# Patient Record
Sex: Female | Born: 1965 | Race: White | Hispanic: No | Smoking: Current every day smoker
Health system: Southern US, Community
[De-identification: ages and names within clinical notes are randomized; demographics above are authoritative.]

## PROBLEM LIST (undated history)

## (undated) DIAGNOSIS — J45909 Unspecified asthma, uncomplicated: Secondary | ICD-10-CM

## (undated) DIAGNOSIS — Z9889 Other specified postprocedural states: Secondary | ICD-10-CM

## (undated) DIAGNOSIS — R112 Nausea with vomiting, unspecified: Secondary | ICD-10-CM

## (undated) HISTORY — PX: CHOLECYSTECTOMY: SHX55

## (undated) HISTORY — PX: TUBAL LIGATION: SHX77

---

## 2005-05-13 ENCOUNTER — Ambulatory Visit (HOSPITAL_COMMUNITY): Admission: RE | Admit: 2005-05-13 | Discharge: 2005-05-13 | Payer: Self-pay | Admitting: Family Medicine

## 2007-12-29 ENCOUNTER — Ambulatory Visit (HOSPITAL_COMMUNITY): Admission: RE | Admit: 2007-12-29 | Discharge: 2007-12-29 | Payer: Self-pay | Admitting: Family Medicine

## 2008-01-18 ENCOUNTER — Ambulatory Visit (HOSPITAL_COMMUNITY): Admission: RE | Admit: 2008-01-18 | Discharge: 2008-01-18 | Payer: Self-pay | Admitting: Family Medicine

## 2010-07-10 ENCOUNTER — Ambulatory Visit (HOSPITAL_COMMUNITY)
Admission: RE | Admit: 2010-07-10 | Discharge: 2010-07-10 | Payer: Self-pay | Source: Home / Self Care | Attending: Family Medicine | Admitting: Family Medicine

## 2010-07-19 ENCOUNTER — Encounter: Payer: Self-pay | Admitting: Family Medicine

## 2010-07-21 ENCOUNTER — Encounter: Payer: Self-pay | Admitting: Family Medicine

## 2010-11-14 NOTE — Procedures (Signed)
NAME:  Rachael Waller, Rachael Waller               ACCOUNT NO.:  [id number]   MEDICAL RECORD NO.:  [id number]          PATIENT TYPE:  OUT   LOCATION:  RESP                          FACILITY:  [facility]   PHYSICIAN:  Edward L. Juanetta Gosling, M.D.DATE OF BIRTH:  04/03/1966   DATE OF PROCEDURE:  01/19/2008  DATE OF DISCHARGE:  01/18/2008                            PULMONARY FUNCTION TEST   Spirometry shows a mild ventilatory defect with evidence of airflow  obstruction in the smaller airways.  This is consistent with a diagnosis  of asthma, but also consistent with a diagnosis of COPD, depending on  the clinical situation.      Edward L. Juanetta Gosling, M.D.  Electronically Signed     EL[facility]/MEDQ  D:  01/19/2008  T:  01/20/2008  Job:  [id number]

## 2012-07-12 ENCOUNTER — Other Ambulatory Visit (HOSPITAL_COMMUNITY): Payer: Self-pay | Admitting: Family Medicine

## 2012-07-12 DIAGNOSIS — Z139 Encounter for screening, unspecified: Secondary | ICD-10-CM

## 2012-07-18 ENCOUNTER — Ambulatory Visit (HOSPITAL_COMMUNITY): Payer: Self-pay

## 2014-07-24 ENCOUNTER — Other Ambulatory Visit (HOSPITAL_COMMUNITY): Payer: Self-pay | Admitting: Family Medicine

## 2014-07-24 DIAGNOSIS — Z1231 Encounter for screening mammogram for malignant neoplasm of breast: Secondary | ICD-10-CM

## 2014-07-25 ENCOUNTER — Other Ambulatory Visit (HOSPITAL_COMMUNITY): Payer: Self-pay | Admitting: Family Medicine

## 2014-07-25 DIAGNOSIS — Z09 Encounter for follow-up examination after completed treatment for conditions other than malignant neoplasm: Secondary | ICD-10-CM

## 2014-07-30 ENCOUNTER — Ambulatory Visit (HOSPITAL_COMMUNITY): Payer: Self-pay

## 2014-08-14 ENCOUNTER — Ambulatory Visit (HOSPITAL_COMMUNITY): Payer: Self-pay

## 2014-08-17 ENCOUNTER — Ambulatory Visit (HOSPITAL_COMMUNITY): Payer: Self-pay

## 2014-08-21 ENCOUNTER — Ambulatory Visit (HOSPITAL_COMMUNITY): Payer: Self-pay

## 2015-05-02 ENCOUNTER — Ambulatory Visit (HOSPITAL_COMMUNITY)
Admission: RE | Admit: 2015-05-02 | Discharge: 2015-05-02 | Disposition: A | Discharge status: Home / Self Care | Payer: 59 | Source: Ambulatory Visit | Attending: Family Medicine | Admitting: Family Medicine

## 2015-05-02 ENCOUNTER — Other Ambulatory Visit (HOSPITAL_COMMUNITY): Payer: Self-pay | Admitting: Family Medicine

## 2015-05-02 DIAGNOSIS — M21611 Bunion of right foot: Secondary | ICD-10-CM | POA: Diagnosis not present

## 2015-05-02 DIAGNOSIS — M79671 Pain in right foot: Secondary | ICD-10-CM

## 2015-05-02 DIAGNOSIS — M19071 Primary osteoarthritis, right ankle and foot: Secondary | ICD-10-CM | POA: Insufficient documentation

## 2015-05-02 DIAGNOSIS — N6002 Solitary cyst of left breast: Secondary | ICD-10-CM

## 2015-05-14 ENCOUNTER — Encounter (HOSPITAL_COMMUNITY): Payer: 59

## 2015-05-28 ENCOUNTER — Encounter (HOSPITAL_COMMUNITY): Payer: 59

## 2015-10-14 ENCOUNTER — Emergency Department (HOSPITAL_COMMUNITY): Payer: 59

## 2015-10-14 ENCOUNTER — Emergency Department (HOSPITAL_COMMUNITY)
Admission: EM | Admit: 2015-10-14 | Discharge: 2015-10-15 | Disposition: A | Discharge status: Home / Self Care | Payer: 59 | Attending: Emergency Medicine | Admitting: Emergency Medicine

## 2015-10-14 ENCOUNTER — Encounter (HOSPITAL_COMMUNITY): Payer: Self-pay | Admitting: Emergency Medicine

## 2015-10-14 DIAGNOSIS — F17210 Nicotine dependence, cigarettes, uncomplicated: Secondary | ICD-10-CM | POA: Diagnosis not present

## 2015-10-14 DIAGNOSIS — K5900 Constipation, unspecified: Secondary | ICD-10-CM

## 2015-10-14 DIAGNOSIS — J45909 Unspecified asthma, uncomplicated: Secondary | ICD-10-CM | POA: Diagnosis not present

## 2015-10-14 DIAGNOSIS — Z79899 Other long term (current) drug therapy: Secondary | ICD-10-CM | POA: Diagnosis not present

## 2015-10-14 DIAGNOSIS — R1031 Right lower quadrant pain: Secondary | ICD-10-CM | POA: Diagnosis present

## 2015-10-14 DIAGNOSIS — R1011 Right upper quadrant pain: Secondary | ICD-10-CM

## 2015-10-14 HISTORY — DX: Unspecified asthma, uncomplicated: J45.909

## 2015-10-14 LAB — BASIC METABOLIC PANEL
Anion gap: 8 (ref 5–15)
BUN: 9 mg/dL (ref 6–20)
CO2: 23 mmol/L (ref 22–32)
CREATININE: 0.58 mg/dL (ref 0.44–1.00)
Calcium: 8.6 mg/dL — ABNORMAL LOW (ref 8.9–10.3)
Chloride: 107 mmol/L (ref 101–111)
GFR calc Af Amer: >60 mL/min (ref >60)
Glucose, Bld: 92 mg/dL (ref 65–99)
Potassium: 3.4 mmol/L — ABNORMAL LOW (ref 3.5–5.1)
Sodium: 138 mmol/L (ref 135–145)

## 2015-10-14 LAB — CBC WITH DIFFERENTIAL/PLATELET
BASOS PCT: 0 %
Basophils Absolute: 0 10*3/uL (ref 0.0–0.1)
EOS ABS: 0.5 10*3/uL (ref 0.0–0.7)
Eosinophils Relative: 4 %
HCT: 39.9 % (ref 36.0–46.0)
Hemoglobin: 13.2 g/dL (ref 12.0–15.0)
Lymphocytes Relative: 41 %
Lymphs Abs: 4.2 10*3/uL — ABNORMAL HIGH (ref 0.7–4.0)
MCH: 30.1 pg (ref 26.0–34.0)
MCHC: 33.1 g/dL (ref 30.0–36.0)
MCV: 91.1 fL (ref 78.0–100.0)
Monocytes Absolute: 0.5 10*3/uL (ref 0.1–1.0)
Monocytes Relative: 5 %
Neutro Abs: 5.1 10*3/uL (ref 1.7–7.7)
Neutrophils Relative %: 50 %
Platelets: 318 10*3/uL (ref 150–400)
RBC: 4.38 MIL/uL (ref 3.87–5.11)
RDW: 13.4 % (ref 11.5–15.5)
WBC: 10.3 10*3/uL (ref 4.0–10.5)

## 2015-10-14 LAB — URINE MICROSCOPIC-ADD ON

## 2015-10-14 LAB — URINALYSIS, ROUTINE W REFLEX MICROSCOPIC
BILIRUBIN URINE: NEGATIVE
Glucose, UA: NEGATIVE mg/dL
Ketones, ur: NEGATIVE mg/dL
Leukocytes, UA: NEGATIVE
Nitrite: NEGATIVE
Protein, ur: NEGATIVE mg/dL
Specific Gravity, Urine: 1.01 (ref 1.005–1.030)
pH: 6 (ref 5.0–8.0)

## 2015-10-14 MED ORDER — KETOROLAC TROMETHAMINE 30 MG/ML IJ SOLN
30.0000 mg | Freq: Once | INTRAMUSCULAR | Status: AC
  Administered 2015-10-14: 30 mg via INTRAVENOUS
  Filled 2015-10-14: qty 1

## 2015-10-14 NOTE — ED Provider Notes (Signed)
CSN: [id number]     Arrival date & time 10/14/15  1809 History  By signing my name below, I, Phillis HaggisGabriella Gaje, attest that this documentation has been prepared under the direction and in the presence of Devoria AlbeIva Sanvi Ehler, MD at 2328 PM Electronically Signed: Phillis HaggisGabriella Gaje, ED Scribe. 10/14/2015. 4:42 AM.    Chief Complaint  Patient presents with  . Abdominal Pain   The history is provided by the patient. No language interpreter was used.  HPI Comments: Rachael Waller is a 50 y.o. Female with a hx of hematuria who presents to the Emergency Department complaining of sharp intermittent RLQ abdominal pain onset 5 days ago. She reports 2 episodes of pain lasting a few seconds on Thursday, April 13. She reports that it has been dull and intermittent for the remainder of the weekend, but began to be dull and constant all day today. She reports associated nausea, decreased appetite, dysuria, and diarrhea x6. Pt reports worsening nausea with eating and worsening pain with movement. 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 She denies injury, fall, fever, vomiting, hematuria, or frequency. She states she has had hematuria in the past but a source was not found for it. Pt is a smoker, 1 ppd. Pt uses an albuterol inhaler and takes Valium.   PCP: Pershing ProudJACKSON,SAMANTHA, PA-C  Past Medical History  Diagnosis Date  . Asthma    Past Surgical History  Procedure Laterality Date  . Cholecystectomy     History reviewed. No pertinent family history. Social History  Substance Use Topics  . Smoking status: Current Every Day Smoker -- 0.50 packs/day    Types: Cigarettes  . Smokeless tobacco: None  . Alcohol Use: No   employed  OB History    No data available     Review of Systems  Gastrointestinal: Positive for abdominal pain.  All other systems reviewed and are negative.     Allergies  Review of patient's allergies indicates no known allergies.  Home Medications   Prior to Admission medications    Medication Sig Start Date End Date Taking? Authorizing Provider  albuterol (PROVENTIL HFA;VENTOLIN HFA) 108 (90 Base) MCG/ACT inhaler Inhale 1-2 puffs into the lungs every 6 (six) hours as needed for wheezing or shortness of breath.   Yes Historical Provider, MD  albuterol (PROVENTIL) (2.5 MG/3ML) 0.083% nebulizer solution Take 2.5 mg by nebulization every 6 (six) hours as needed for wheezing or shortness of breath.   Yes Historical Provider, MD  diazepam (VALIUM) 10 MG tablet Take 10 mg by mouth every 12 (twelve) hours as needed for anxiety.   Yes Historical Provider, MD  ketotifen (ALLERGY EYE DROPS) 0.025 % ophthalmic solution Place 1 drop into both eyes daily as needed (for allergy eye).   Yes Historical Provider, MD   BP 110/72 mmHg  Pulse 65  Temp(Src) 98.3 F (36.8 C) (Oral)  Resp 16  Ht 5\' 2"  (1.575 m)  Wt 113 lb (51.256 kg)  BMI 20.66 kg/m2  SpO2 100%  Vital signs normal   Physical Exam  Constitutional: She is oriented to person, place, and time. She appears well-developed and well-nourished.  Non-toxic appearance. She does not appear ill. No distress.  HENT:  Head: Normocephalic and atraumatic.  Right Ear: External ear normal.  Left Ear: External ear normal.  Nose: Nose normal. No mucosal edema or rhinorrhea.  Mouth/Throat: Oropharynx is clear and moist and mucous membranes are normal. No dental abscesses or uvula swelling.  Eyes: Conjunctivae and EOM are  normal. Pupils are equal, round, and reactive to light.  Neck: Normal range of motion and full passive range of motion without pain. Neck supple.  Cardiovascular: Normal rate, regular rhythm and normal heart sounds.  Exam reveals no gallop and no friction rub.   No murmur heard. Pulmonary/Chest: Effort normal and breath sounds normal. No respiratory distress. She has no wheezes. She has no rhonchi. She has no rales. She exhibits no tenderness and no crepitus.  Abdominal: Soft. Normal appearance and bowel sounds are normal.  She exhibits no distension. There is tenderness. There is no rebound, no guarding and no CVA tenderness.    Tenderness to mid right abdomen  Genitourinary:  No CVA tenderness  Musculoskeletal: Normal range of motion. She exhibits no edema or tenderness.  Moves all extremities well.   Neurological: She is alert and oriented to person, place, and time. She has normal strength. No cranial nerve deficit.  Skin: Skin is warm, dry and intact. No rash noted. No erythema. No pallor.  Psychiatric: She has a normal mood and affect. Her speech is normal and behavior is normal. Her mood appears not anxious.  Nursing note and vitals reviewed.   ED Course  Procedures (including critical care time)  Medications  ketorolac (TORADOL) 30 MG/ML injection 30 mg (30 mg Intravenous Given 10/14/15 2341)    DIAGNOSTIC STUDIES: Oxygen Saturation is 100% on RA, normal by my interpretation.    COORDINATION OF CARE: 11:35 PM-Discussed treatment plan which includes labs with pt at bedside and pt agreed to plan.    Patient's pain improved with the Toradol. We discussed her CT scan and we look at it shows a lot of stool in her right colon. She was advised to take MiraLAX over-the-counter. She should return if she gets worse such as getting a fever, vomiting or worsening pain.  Labs Review Results for orders placed or performed during the hospital encounter of 10/14/15  Urinalysis, Routine w reflex microscopic (not at [facility])  Result Value Ref Range   Color, Urine YELLOW YELLOW   APPearance CLEAR CLEAR   Specific Gravity, Urine 1.010 1.005 - 1.030   pH 6.0 5.0 - 8.0   Glucose, UA NEGATIVE NEGATIVE mg/dL   Hgb urine dipstick MODERATE (A) NEGATIVE   Bilirubin Urine NEGATIVE NEGATIVE   Ketones, ur NEGATIVE NEGATIVE mg/dL   Protein, ur NEGATIVE NEGATIVE mg/dL   Nitrite NEGATIVE NEGATIVE   Leukocytes, UA NEGATIVE NEGATIVE  CBC with Differential  Result Value Ref Range   WBC 10.3 4.0 - 10.5 K/uL   RBC 4.38  3.87 - 5.11 MIL/uL   Hemoglobin 13.2 12.0 - 15.0 g/dL   HCT 11.9 14.7 - 82.9 %   MCV 91.1 78.0 - 100.0 fL   MCH 30.1 26.0 - 34.0 pg   MCHC 33.1 30.0 - 36.0 g/dL   RDW 56.2 13.0 - 86.5 %   Platelets 318 150 - 400 K/uL   Neutrophils Relative % 50 %   Neutro Abs 5.1 1.7 - 7.7 K/uL   Lymphocytes Relative 41 %   Lymphs Abs 4.2 (H) 0.7 - 4.0 K/uL   Monocytes Relative 5 %   Monocytes Absolute 0.5 0.1 - 1.0 K/uL   Eosinophils Relative 4 %   Eosinophils Absolute 0.5 0.0 - 0.7 K/uL   Basophils Relative 0 %   Basophils Absolute 0.0 0.0 - 0.1 K/uL  Basic metabolic panel  Result Value Ref Range   Sodium 138 135 - 145 mmol/L   Potassium 3.4 (L) 3.5 -  5.1 mmol/L   Chloride 107 101 - 111 mmol/L   CO2 23 22 - 32 mmol/L   Glucose, Bld 92 65 - 99 mg/dL   BUN 9 6 - 20 mg/dL   Creatinine, Ser 1.61 0.44 - 1.00 mg/dL   Calcium 8.6 (L) 8.9 - 10.3 mg/dL   GFR calc non Af Amer >60 >60 mL/min   GFR calc Af Amer >60 >60 mL/min   Anion gap 8 5 - 15  Urine microscopic-add on  Result Value Ref Range   Squamous Epithelial / LPF 0-5 (A) NONE SEEN   WBC, UA 0-5 0 - 5 WBC/hpf   RBC / HPF 0-5 0 - 5 RBC/hpf   Bacteria, UA FEW (A) NONE SEEN   Laboratory interpretation all normal except microscopic hematuria   Imaging Review Ct Renal Stone Study  10/15/2015  CLINICAL DATA:  Sharp intermittent right lower quadrant abdominal pain for 5 days. Nausea, decreased appetite, dysuria, and diarrhea. EXAM: CT ABDOMEN AND PELVIS WITHOUT CONTRAST TECHNIQUE: Multidetector CT imaging of the abdomen and pelvis was performed following the standard protocol without IV contrast. COMPARISON:  None. FINDINGS: Mild dependent atelectasis in the lung bases. Low lying right kidney. Kidneys otherwise appear intact and symmetrical. No hydronephrosis or hydroureter. No renal, ureteral, or bladder stones. Bladder wall is not thickened. Surgical absence of the gallbladder. The unenhanced appearance of the liver, spleen, adrenal glands,  abdominal aorta, inferior vena cava, and retroperitoneal lymph nodes is unremarkable. Small focal calcifications of the body of the pancreas may represent changes due to chronic pancreatitis or could indicate calcification adjacent vessels. Stomach, small bowel, and colon are not abnormally distended. Scattered stool throughout the colon. No free air or free fluid in the abdomen. Pelvis: The appendix is normal. Surgical clips consistent with tubal ligations. Uterus and ovaries are otherwise normal. No free or loculated pelvic fluid collections. No pelvic mass or lymphadenopathy. No destructive bone lesions. IMPRESSION: No renal or ureteral stone or obstruction. Electronically Signed   By: Burman Nieves M.D.   On: 10/15/2015 00:05   I have personally reviewed and evaluated these images and lab results as part of my medical decision-making.   EKG Interpretation None      MDM   Final diagnoses:  Right upper quadrant pain  Constipation, unspecified constipation type   Discharge medications miralax OTC  Plan discharge  Devoria Albe, MD, Concha Pyo, MD 10/15/15 [phone]

## 2015-10-14 NOTE — ED Notes (Signed)
Having sharp and dull pain to right abdomen.  C/o loose pasty stools (5-6), brown in color.  Rates pain 10/10.

## 2015-10-15 NOTE — Discharge Instructions (Signed)
Get miralax and put one dose or 17 g in 8 ounces of water,  take 1 dose every 30 minutes for 2-3 hours or until you  get good results and then once or twice daily to prevent constipation. Return to the ED if you get worse such as fever, vomiting worsening abdominal pain.     Constipation, Adult Constipation is when a person:  Poops (has a bowel movement) less than 3 times a week.  Has a hard time pooping.  Has poop that is dry, hard, or bigger than normal. HOME CARE   Eat foods with a lot of fiber in them. This includes fruits, vegetables, beans, and whole grains such as brown rice.  Avoid fatty foods and foods with a lot of sugar. This includes french fries, hamburgers, cookies, candy, and soda.  If you are not getting enough fiber from food, take products with added fiber in them (supplements).  Drink enough fluid to keep your pee (urine) clear or pale yellow.  Exercise on a regular basis, or as told by your doctor.  Go to the restroom when you feel like you need to poop. Do not hold it.  Only take medicine as told by your doctor. Do not take medicines that help you poop (laxatives) without talking to your doctor first. GET HELP RIGHT AWAY IF:   You have bright red blood in your poop (stool).  Your constipation lasts more than 4 days or gets worse.  You have belly (abdominal) or butt (rectal) pain.  You have thin poop (as thin as a pencil).  You lose weight, and it cannot be explained. MAKE SURE YOU:   Understand these instructions.  Will watch your condition.  Will get help right away if you are not doing well or get worse.   This information is not intended to replace advice given to you by your health care provider. Make sure you discuss any questions you have with your health care provider.   Document Released: 12/02/2007 Document Revised: 07/06/2014 Document Reviewed: 03/27/2013 Elsevier Interactive Patient Education [employer]2016 [employer] Inc[employer]

## 2016-10-31 DIAGNOSIS — J45909 Unspecified asthma, uncomplicated: Secondary | ICD-10-CM | POA: Diagnosis not present

## 2016-10-31 DIAGNOSIS — R509 Fever, unspecified: Secondary | ICD-10-CM | POA: Diagnosis not present

## 2016-10-31 DIAGNOSIS — J189 Pneumonia, unspecified organism: Secondary | ICD-10-CM | POA: Diagnosis not present

## 2016-10-31 DIAGNOSIS — R21 Rash and other nonspecific skin eruption: Secondary | ICD-10-CM | POA: Diagnosis not present

## 2016-10-31 DIAGNOSIS — F17200 Nicotine dependence, unspecified, uncomplicated: Secondary | ICD-10-CM | POA: Diagnosis not present

## 2016-11-05 DIAGNOSIS — Z1389 Encounter for screening for other disorder: Secondary | ICD-10-CM | POA: Diagnosis not present

## 2016-11-05 DIAGNOSIS — Z6820 Body mass index (BMI) 20.0-20.9, adult: Secondary | ICD-10-CM | POA: Diagnosis not present

## 2016-11-05 DIAGNOSIS — J181 Lobar pneumonia, unspecified organism: Secondary | ICD-10-CM | POA: Diagnosis not present

## 2016-11-05 DIAGNOSIS — N390 Urinary tract infection, site not specified: Secondary | ICD-10-CM | POA: Diagnosis not present

## 2016-11-05 DIAGNOSIS — F4320 Adjustment disorder, unspecified: Secondary | ICD-10-CM | POA: Diagnosis not present

## 2016-11-09 ENCOUNTER — Other Ambulatory Visit (HOSPITAL_COMMUNITY): Payer: Self-pay | Admitting: Family Medicine

## 2016-11-09 DIAGNOSIS — Z1231 Encounter for screening mammogram for malignant neoplasm of breast: Secondary | ICD-10-CM

## 2017-07-01 DIAGNOSIS — Z6821 Body mass index (BMI) 21.0-21.9, adult: Secondary | ICD-10-CM | POA: Diagnosis not present

## 2017-07-01 DIAGNOSIS — Z1389 Encounter for screening for other disorder: Secondary | ICD-10-CM | POA: Diagnosis not present

## 2017-07-01 DIAGNOSIS — M19071 Primary osteoarthritis, right ankle and foot: Secondary | ICD-10-CM | POA: Diagnosis not present

## 2017-07-01 DIAGNOSIS — D179 Benign lipomatous neoplasm, unspecified: Secondary | ICD-10-CM | POA: Diagnosis not present

## 2017-07-15 DIAGNOSIS — Z124 Encounter for screening for malignant neoplasm of cervix: Secondary | ICD-10-CM | POA: Diagnosis not present

## 2017-07-15 DIAGNOSIS — Z9189 Other specified personal risk factors, not elsewhere classified: Secondary | ICD-10-CM | POA: Diagnosis not present

## 2017-07-15 DIAGNOSIS — Z01411 Encounter for gynecological examination (general) (routine) with abnormal findings: Secondary | ICD-10-CM | POA: Diagnosis not present

## 2017-07-15 DIAGNOSIS — Z6821 Body mass index (BMI) 21.0-21.9, adult: Secondary | ICD-10-CM | POA: Diagnosis not present

## 2017-07-15 DIAGNOSIS — J069 Acute upper respiratory infection, unspecified: Secondary | ICD-10-CM | POA: Diagnosis not present

## 2017-07-15 DIAGNOSIS — Z1389 Encounter for screening for other disorder: Secondary | ICD-10-CM | POA: Diagnosis not present

## 2017-07-19 DIAGNOSIS — L720 Epidermal cyst: Secondary | ICD-10-CM | POA: Diagnosis not present

## 2017-08-25 DIAGNOSIS — Z6821 Body mass index (BMI) 21.0-21.9, adult: Secondary | ICD-10-CM | POA: Diagnosis not present

## 2017-08-25 DIAGNOSIS — Z1389 Encounter for screening for other disorder: Secondary | ICD-10-CM | POA: Diagnosis not present

## 2017-08-27 DIAGNOSIS — Z6821 Body mass index (BMI) 21.0-21.9, adult: Secondary | ICD-10-CM | POA: Diagnosis not present

## 2017-08-27 DIAGNOSIS — R87810 Cervical high risk human papillomavirus (HPV) DNA test positive: Secondary | ICD-10-CM | POA: Diagnosis not present

## 2017-10-12 DIAGNOSIS — Z6821 Body mass index (BMI) 21.0-21.9, adult: Secondary | ICD-10-CM | POA: Diagnosis not present

## 2017-10-12 DIAGNOSIS — J45909 Unspecified asthma, uncomplicated: Secondary | ICD-10-CM | POA: Diagnosis not present

## 2018-02-25 DIAGNOSIS — N390 Urinary tract infection, site not specified: Secondary | ICD-10-CM | POA: Diagnosis not present

## 2018-07-14 DIAGNOSIS — R0982 Postnasal drip: Secondary | ICD-10-CM | POA: Diagnosis not present

## 2018-07-14 DIAGNOSIS — Z124 Encounter for screening for malignant neoplasm of cervix: Secondary | ICD-10-CM | POA: Diagnosis not present

## 2018-07-14 DIAGNOSIS — R87619 Unspecified abnormal cytological findings in specimens from cervix uteri: Secondary | ICD-10-CM | POA: Diagnosis not present

## 2018-07-14 DIAGNOSIS — Z6821 Body mass index (BMI) 21.0-21.9, adult: Secondary | ICD-10-CM | POA: Diagnosis not present

## 2018-07-14 DIAGNOSIS — Z1389 Encounter for screening for other disorder: Secondary | ICD-10-CM | POA: Diagnosis not present

## 2018-08-11 DIAGNOSIS — J111 Influenza due to unidentified influenza virus with other respiratory manifestations: Secondary | ICD-10-CM | POA: Diagnosis not present

## 2018-08-11 DIAGNOSIS — Z6821 Body mass index (BMI) 21.0-21.9, adult: Secondary | ICD-10-CM | POA: Diagnosis not present

## 2018-08-11 DIAGNOSIS — Z1389 Encounter for screening for other disorder: Secondary | ICD-10-CM | POA: Diagnosis not present

## 2019-02-07 ENCOUNTER — Other Ambulatory Visit (HOSPITAL_COMMUNITY): Payer: Self-pay | Admitting: Family Medicine

## 2019-02-07 DIAGNOSIS — R928 Other abnormal and inconclusive findings on diagnostic imaging of breast: Secondary | ICD-10-CM

## 2019-03-26 DIAGNOSIS — M542 Cervicalgia: Secondary | ICD-10-CM | POA: Diagnosis not present

## 2019-03-26 DIAGNOSIS — S199XXA Unspecified injury of neck, initial encounter: Secondary | ICD-10-CM | POA: Diagnosis not present

## 2019-03-26 DIAGNOSIS — F17200 Nicotine dependence, unspecified, uncomplicated: Secondary | ICD-10-CM | POA: Diagnosis not present

## 2019-03-26 DIAGNOSIS — M79602 Pain in left arm: Secondary | ICD-10-CM | POA: Diagnosis not present

## 2019-03-26 DIAGNOSIS — M5412 Radiculopathy, cervical region: Secondary | ICD-10-CM | POA: Diagnosis not present

## 2019-09-01 DIAGNOSIS — M25551 Pain in right hip: Secondary | ICD-10-CM | POA: Diagnosis not present

## 2019-09-01 DIAGNOSIS — F419 Anxiety disorder, unspecified: Secondary | ICD-10-CM | POA: Diagnosis not present

## 2019-10-15 ENCOUNTER — Emergency Department (HOSPITAL_COMMUNITY): Payer: Commercial Managed Care - PPO

## 2019-10-15 ENCOUNTER — Other Ambulatory Visit: Payer: Self-pay

## 2019-10-15 ENCOUNTER — Emergency Department (HOSPITAL_COMMUNITY)
Admission: EM | Admit: 2019-10-15 | Discharge: 2019-10-15 | Disposition: A | Discharge status: Home / Self Care | Payer: Commercial Managed Care - PPO | Attending: Emergency Medicine | Admitting: Emergency Medicine

## 2019-10-15 ENCOUNTER — Encounter (HOSPITAL_COMMUNITY): Payer: Self-pay

## 2019-10-15 DIAGNOSIS — F17210 Nicotine dependence, cigarettes, uncomplicated: Secondary | ICD-10-CM | POA: Insufficient documentation

## 2019-10-15 DIAGNOSIS — M5136 Other intervertebral disc degeneration, lumbar region: Secondary | ICD-10-CM | POA: Insufficient documentation

## 2019-10-15 DIAGNOSIS — M545 Low back pain, unspecified: Secondary | ICD-10-CM

## 2019-10-15 MED ORDER — KETOROLAC TROMETHAMINE 60 MG/2ML IM SOLN
60.0000 mg | Freq: Once | INTRAMUSCULAR | Status: AC
  Administered 2019-10-15: 60 mg via INTRAMUSCULAR
  Filled 2019-10-15: qty 2

## 2019-10-15 MED ORDER — DICLOFENAC SODIUM 50 MG PO TBEC: 50.0000 mg | DELAYED_RELEASE_TABLET | Freq: Two times a day (BID) | ORAL | 0 refills | Status: AC

## 2019-10-15 MED ORDER — METHOCARBAMOL 500 MG PO TABS: 500.0000 mg | ORAL_TABLET | Freq: Four times a day (QID) | ORAL | 0 refills | Status: AC

## 2019-10-15 NOTE — Discharge Instructions (Signed)
Return if any problems.  See your Physician for recheck  °

## 2019-10-15 NOTE — ED Triage Notes (Signed)
Pt started having back pain yesterday that is located in her lumbar spine. States this pain doesn't radiate anywhere. She has taken Tylenol and Ibuprofen with no relief. Larey Seat down her steps last year and hurt her back for the first time then.

## 2019-10-15 NOTE — ED Provider Notes (Addendum)
[facility] EMERGENCY DEPARTMENT Provider Note   CSN: [id number] Arrival date & time: 10/15/19  0944     History Chief Complaint  Patient presents with  . Back Pain    Rachael Waller is a 54 y.o. female.  The history is provided by the patient. No language interpreter was used.  Back Pain Location:  Lumbar spine Quality:  Aching Radiates to:  Does not radiate Pain severity:  Severe Pain is:  Same all the time Onset quality:  Gradual Timing:  Constant Progression:  Worsening Relieved by:  Nothing Worsened by:  Nothing Ineffective treatments:  None tried Associated symptoms: no abdominal pain, no numbness and no paresthesias   Risk factors: not obese   Pt reports she fell and hit her low back a year ago.  Pt reports some pain on and off.  Pt reports increasing pain recently.  No numbness      Past Medical History:  Diagnosis Date  . Asthma     There are no problems to display for this patient.   Past Surgical History:  Procedure Laterality Date  . CHOLECYSTECTOMY       OB History   No obstetric history on file.     No family history on file.  Social History   Tobacco Use  . Smoking status: Current Every Day Smoker    Packs/day: 0.50    Types: Cigarettes  . Smokeless tobacco: Never Used  Substance Use Topics  . Alcohol use: No  . Drug use: No    Home Medications Prior to Admission medications   Medication Sig Start Date End Date Taking? Authorizing Provider  albuterol (PROVENTIL HFA;VENTOLIN HFA) 108 (90 Base) MCG/ACT inhaler Inhale 1-2 puffs into the lungs every 6 (six) hours as needed for wheezing or shortness of breath.    [provider]  albuterol (PROVENTIL) (2.5 MG/3ML) 0.083% nebulizer solution Take 2.5 mg by nebulization every 6 (six) hours as needed for wheezing or shortness of breath.    [provider]  diazepam (VALIUM) 10 MG tablet Take 10 mg by mouth every 12 (twelve) hours as needed for anxiety.    [provider]  diclofenac (VOLTAREN) 50 MG EC tablet Take 1 tablet (50 mg total) by mouth 2 (two) times daily. 10/15/19   Fransico Meadow, PA-C  ketotifen (ALLERGY EYE DROPS) 0.025 % ophthalmic solution Place 1 drop into both eyes daily as needed (for allergy eye).    [provider]  methocarbamol (ROBAXIN) 500 MG tablet Take 1 tablet (500 mg total) by mouth 4 (four) times daily. 10/15/19   Fransico Meadow, PA-C    Allergies    Patient has no known allergies.  Review of Systems   Review of Systems  Gastrointestinal: Negative for abdominal pain.  Musculoskeletal: Positive for back pain.  Neurological: Negative for numbness and paresthesias.  All other systems reviewed and are negative.   Physical Exam Updated Vital Signs BP 138/70   Pulse 78   Temp 98.2 F (36.8 C) (Oral)   Resp 12   Ht 5\' 2"  (1.575 m)   Wt 62.1 kg   SpO2 99%   BMI 25.06 kg/m   Physical Exam Vitals and nursing note reviewed.  Constitutional:      Appearance: She is well-developed.  HENT:     Head: Normocephalic.     Nose: Nose normal.     Mouth/Throat:     Mouth: Mucous membranes are moist.  Cardiovascular:     Rate  and Rhythm: Normal rate and regular rhythm.  Pulmonary:     Effort: Pulmonary effort is normal.  Abdominal:     General: There is no distension.  Musculoskeletal:        General: Normal range of motion.     Cervical back: Normal range of motion.  Skin:    General: Skin is warm.  Neurological:     General: No focal deficit present.     Mental Status: She is alert and oriented to person, place, and time.  Psychiatric:        Mood and Affect: Mood normal.     ED Results / Procedures / Treatments   Labs (all labs ordered are listed, but only abnormal results are displayed) Labs Reviewed - No data to display  EKG None  Radiology DG Lumbar Spine Complete  Result Date: 10/15/2019 CLINICAL DATA:  Fall down steps/tear. Low back pain since yesterday. No recent injury  reported. EXAM: LUMBAR SPINE - COMPLETE 4+ VIEW COMPARISON:  07/10/2010 lumbar spine radiographs. 10/14/2015 unenhanced CT abdomen/pelvis. FINDINGS: This report assumes 5 non rib-bearing lumbar vertebrae. Lumbar vertebral body heights are preserved, with no fracture. Mild multilevel lumbar degenerative disc disease, most prominent at L2-3, mildly worsened. No spondylolisthesis. No appreciable facet arthropathy. No aggressive appearing focal osseous lesions. Cholecystectomy clips are seen in the right upper quadrant of the abdomen. Abdominal aortic atherosclerosis. IMPRESSION: 1. No lumbar spine fracture or spondylolisthesis. 2. Mild multilevel lumbar degenerative disc disease, most prominent at L2-3, mildly worsened since 2012 radiographs. Electronically Signed   By: Delbert Phenix M.D.   On: 10/15/2019 11:10    Procedures Procedures (including critical care time)  Medications Ordered in ED Medications  ketorolac (TORADOL) injection 60 mg (60 mg Intramuscular Given 10/15/19 1054)    ED Course  I have reviewed the triage vital signs and the nursing notes.  Pertinent labs & imaging results that were available during my care of the patient were reviewed by me and considered in my medical decision making (see chart for details).    MDM Rules/Calculators/A&P                      MDM:  Pt given a toradol injection. Xray shows degenerative changes  Pt reports some relief of pain.  Pt given rx of diflucan and robaxin.   Final Clinical Impression(s) / ED Diagnoses Final diagnoses:  Acute low back pain without sciatica, unspecified back pain laterality  DDD (degenerative disc disease), lumbar    Rx / DC Orders ED Discharge Orders         Ordered    diclofenac (VOLTAREN) 50 MG EC tablet  2 times daily     10/15/19 1151    methocarbamol (ROBAXIN) 500 MG tablet  4 times daily     10/15/19 1151        An After Visit Summary was printed and given to the patient.    Elson Areas,  PA-C 10/15/19 1431    [address] [place] 10/15/19 1432    Geoffery Lyons, MD 10/15/19 1454

## 2020-02-22 ENCOUNTER — Other Ambulatory Visit (HOSPITAL_COMMUNITY): Payer: Self-pay | Admitting: Orthopedic Surgery

## 2020-03-08 ENCOUNTER — Encounter (HOSPITAL_BASED_OUTPATIENT_CLINIC_OR_DEPARTMENT_OTHER): Payer: Self-pay | Admitting: Orthopedic Surgery

## 2020-03-08 ENCOUNTER — Other Ambulatory Visit: Payer: Self-pay

## 2020-03-11 ENCOUNTER — Other Ambulatory Visit (HOSPITAL_COMMUNITY)
Admission: RE | Admit: 2020-03-11 | Discharge: 2020-03-11 | Disposition: A | Discharge status: Home / Self Care | Payer: Commercial Managed Care - PPO | Source: Ambulatory Visit | Attending: Orthopedic Surgery | Admitting: Orthopedic Surgery

## 2020-03-11 DIAGNOSIS — Z20822 Contact with and (suspected) exposure to covid-19: Secondary | ICD-10-CM | POA: Insufficient documentation

## 2020-03-11 DIAGNOSIS — Z01812 Encounter for preprocedural laboratory examination: Secondary | ICD-10-CM | POA: Diagnosis present

## 2020-03-11 LAB — SARS CORONAVIRUS 2 (TAT 6-24 HRS): SARS Coronavirus 2: NEGATIVE

## 2020-03-11 NOTE — Progress Notes (Signed)

## 2020-03-13 NOTE — Anesthesia Preprocedure Evaluation (Addendum)
Anesthesia Evaluation  Patient identified by MRN, date of birth, ID band Patient awake    Reviewed: Allergy & Precautions, NPO status , Patient's Chart, lab work & pertinent test results  History of Anesthesia Complications (+) PONV and history of anesthetic complications  Airway Mallampati: II  TM Distance: >3 FB Neck ROM: Full    Dental  (+) Edentulous Upper, Dental Advisory Given, Missing, Partial Lower   Pulmonary asthma , Current Smoker and Patient abstained from smoking.,    Pulmonary exam normal breath sounds clear to auscultation       Cardiovascular negative cardio ROS Normal cardiovascular exam Rhythm:Regular Rate:Normal     Neuro/Psych negative neurological ROS     GI/Hepatic negative GI ROS, Neg liver ROS,   Endo/Other  negative endocrine ROS  Renal/GU negative Renal ROS     Musculoskeletal negative musculoskeletal ROS (+)   Abdominal   Peds  Hematology negative hematology ROS (+)   Anesthesia Other Findings   Reproductive/Obstetrics                            Anesthesia Physical Anesthesia Plan  ASA: II  Anesthesia Plan: General   Post-op Pain Management: GA combined w/ Regional for post-op pain   Induction: Intravenous  PONV Risk Score and Plan: 3 and Ondansetron, Treatment may vary due to age or medical condition, Dexamethasone, Midazolam and Propofol infusion  Airway Management Planned: LMA  Additional Equipment: None  Intra-op Plan:   Post-operative Plan: Extubation in OR  Informed Consent: I have reviewed the patients History and Physical, chart, labs and discussed the procedure including the risks, benefits and alternatives for the proposed anesthesia with the patient or authorized representative who has indicated his/her understanding and acceptance.     Dental advisory given  Plan Discussed with: CRNA  Anesthesia Plan Comments:     Anesthesia  Quick Evaluation

## 2020-03-14 ENCOUNTER — Ambulatory Visit (HOSPITAL_BASED_OUTPATIENT_CLINIC_OR_DEPARTMENT_OTHER): Payer: Commercial Managed Care - PPO | Admitting: Anesthesiology

## 2020-03-14 ENCOUNTER — Ambulatory Visit (HOSPITAL_BASED_OUTPATIENT_CLINIC_OR_DEPARTMENT_OTHER)
Admission: RE | Admit: 2020-03-14 | Discharge: 2020-03-14 | Disposition: A | Discharge status: Home / Self Care | Payer: Commercial Managed Care - PPO | Attending: Orthopedic Surgery | Admitting: Orthopedic Surgery

## 2020-03-14 ENCOUNTER — Other Ambulatory Visit: Payer: Self-pay

## 2020-03-14 ENCOUNTER — Encounter (HOSPITAL_BASED_OUTPATIENT_CLINIC_OR_DEPARTMENT_OTHER): Payer: Self-pay | Admitting: Orthopedic Surgery

## 2020-03-14 ENCOUNTER — Encounter (HOSPITAL_BASED_OUTPATIENT_CLINIC_OR_DEPARTMENT_OTHER)
Admission: RE | Disposition: A | Discharge status: Home / Self Care | Payer: Self-pay | Source: Home / Self Care | Attending: Orthopedic Surgery

## 2020-03-14 DIAGNOSIS — F17210 Nicotine dependence, cigarettes, uncomplicated: Secondary | ICD-10-CM | POA: Insufficient documentation

## 2020-03-14 DIAGNOSIS — M2021 Hallux rigidus, right foot: Secondary | ICD-10-CM | POA: Insufficient documentation

## 2020-03-14 DIAGNOSIS — J45909 Unspecified asthma, uncomplicated: Secondary | ICD-10-CM | POA: Diagnosis not present

## 2020-03-14 DIAGNOSIS — Z791 Long term (current) use of non-steroidal anti-inflammatories (NSAID): Secondary | ICD-10-CM | POA: Diagnosis not present

## 2020-03-14 DIAGNOSIS — M7741 Metatarsalgia, right foot: Secondary | ICD-10-CM | POA: Diagnosis not present

## 2020-03-14 DIAGNOSIS — Z79899 Other long term (current) drug therapy: Secondary | ICD-10-CM | POA: Diagnosis not present

## 2020-03-14 HISTORY — DX: Other specified postprocedural states: Z98.890

## 2020-03-14 HISTORY — DX: Nausea with vomiting, unspecified: R11.2

## 2020-03-14 HISTORY — PX: WEIL OSTEOTOMY: SHX5044

## 2020-03-14 HISTORY — PX: ARTHRODESIS METATARSALPHALANGEAL JOINT (MTPJ): SHX6566

## 2020-03-14 SURGERY — FUSION, JOINT, GREAT TOE
Anesthesia: Regional | Site: Foot | Laterality: Right

## 2020-03-14 MED ORDER — LIDOCAINE HCL (CARDIAC) PF 100 MG/5ML IV SOSY
PREFILLED_SYRINGE | INTRAVENOUS | Status: DC | PRN
  Administered 2020-03-14: 100 mg via INTRAVENOUS

## 2020-03-14 MED ORDER — FENTANYL CITRATE (PF) 100 MCG/2ML IJ SOLN
INTRAMUSCULAR | Status: AC
  Filled 2020-03-14: qty 2

## 2020-03-14 MED ORDER — PROMETHAZINE HCL 25 MG/ML IJ SOLN: 6.2500 mg | INTRAMUSCULAR | Status: DC | PRN

## 2020-03-14 MED ORDER — VANCOMYCIN HCL 500 MG IV SOLR
INTRAVENOUS | Status: DC | PRN
  Administered 2020-03-14: 500 mg via TOPICAL

## 2020-03-14 MED ORDER — PROPOFOL 500 MG/50ML IV EMUL
INTRAVENOUS | Status: DC | PRN
  Administered 2020-03-14: 25 ug/kg/min via INTRAVENOUS

## 2020-03-14 MED ORDER — CLONIDINE HCL (ANALGESIA) 100 MCG/ML EP SOLN
EPIDURAL | Status: DC | PRN
  Administered 2020-03-14: 80 ug

## 2020-03-14 MED ORDER — BUPIVACAINE HCL (PF) 0.5 % IJ SOLN
INTRAMUSCULAR | Status: AC
  Filled 2020-03-14: qty 30

## 2020-03-14 MED ORDER — VANCOMYCIN HCL 500 MG IV SOLR
INTRAVENOUS | Status: AC
  Filled 2020-03-14: qty 500

## 2020-03-14 MED ORDER — DEXAMETHASONE SODIUM PHOSPHATE 4 MG/ML IJ SOLN
INTRAMUSCULAR | Status: DC | PRN
  Administered 2020-03-14: 8 mg via INTRAVENOUS

## 2020-03-14 MED ORDER — DEXAMETHASONE SODIUM PHOSPHATE 10 MG/ML IJ SOLN
INTRAMUSCULAR | Status: DC | PRN
  Administered 2020-03-14: 4 mg via INTRAVENOUS

## 2020-03-14 MED ORDER — LACTATED RINGERS IV SOLN: INTRAVENOUS | Status: DC

## 2020-03-14 MED ORDER — LIDOCAINE-EPINEPHRINE 1 %-1:100000 IJ SOLN
INTRAMUSCULAR | Status: AC
  Filled 2020-03-14: qty 1

## 2020-03-14 MED ORDER — MIDAZOLAM HCL 2 MG/2ML IJ SOLN
INTRAMUSCULAR | Status: AC
  Filled 2020-03-14: qty 2

## 2020-03-14 MED ORDER — MEPERIDINE HCL 25 MG/ML IJ SOLN: 6.2500 mg | INTRAMUSCULAR | Status: DC | PRN

## 2020-03-14 MED ORDER — CEFAZOLIN SODIUM-DEXTROSE 2-4 GM/100ML-% IV SOLN
2.0000 g | INTRAVENOUS | Status: AC
  Administered 2020-03-14: 2 g via INTRAVENOUS

## 2020-03-14 MED ORDER — BUPIVACAINE-EPINEPHRINE (PF) 0.5% -1:200000 IJ SOLN
INTRAMUSCULAR | Status: DC | PRN
  Administered 2020-03-14: 30 mL via PERINEURAL

## 2020-03-14 MED ORDER — FENTANYL CITRATE (PF) 100 MCG/2ML IJ SOLN
100.0000 ug | Freq: Once | INTRAMUSCULAR | Status: AC
  Administered 2020-03-14: 100 ug via INTRAVENOUS

## 2020-03-14 MED ORDER — FENTANYL CITRATE (PF) 100 MCG/2ML IJ SOLN: 25.0000 ug | INTRAMUSCULAR | Status: DC | PRN

## 2020-03-14 MED ORDER — OXYCODONE HCL 5 MG PO TABS: 5.0000 mg | ORAL_TABLET | Freq: Four times a day (QID) | ORAL | 0 refills | Status: AC | PRN

## 2020-03-14 MED ORDER — SENNA 8.6 MG PO TABS: 2.0000 | ORAL_TABLET | Freq: Two times a day (BID) | ORAL | 0 refills | Status: AC

## 2020-03-14 MED ORDER — CEFAZOLIN SODIUM-DEXTROSE 2-4 GM/100ML-% IV SOLN
INTRAVENOUS | Status: AC
  Filled 2020-03-14: qty 100

## 2020-03-14 MED ORDER — SODIUM CHLORIDE 0.9 % IV SOLN: INTRAVENOUS | Status: DC

## 2020-03-14 MED ORDER — PROPOFOL 10 MG/ML IV BOLUS
INTRAVENOUS | Status: DC | PRN
  Administered 2020-03-14: 60 mg via INTRAVENOUS
  Administered 2020-03-14: 140 mg via INTRAVENOUS

## 2020-03-14 MED ORDER — DOCUSATE SODIUM 100 MG PO CAPS: 100.0000 mg | ORAL_CAPSULE | Freq: Two times a day (BID) | ORAL | 0 refills | Status: AC

## 2020-03-14 MED ORDER — 0.9 % SODIUM CHLORIDE (POUR BTL) OPTIME
TOPICAL | Status: DC | PRN
  Administered 2020-03-14: 1000 mL

## 2020-03-14 SURGICAL SUPPLY — 93 items
APL PRP STRL LF DISP 70% ISPRP (MISCELLANEOUS) ×1
BANDAGE ESMARK 6X9 LF (GAUZE/BANDAGES/DRESSINGS) IMPLANT
BIT DRILL 2.7XCANN QCK CNCT (BIT) ×1 IMPLANT
BIT DRILL CANN 2.7 (BIT) ×2
BIT DRILL Q-C 2.0 DIA 100 (BIT) ×2 IMPLANT
BIT DRL 2.7XCANN QCK CNCT (BIT) ×1
BLADE AVERAGE 25X9 (BLADE) IMPLANT
BLADE LONG MED 25X9 (BLADE) ×2 IMPLANT
BLADE MICRO SAGITTAL (BLADE) IMPLANT
BLADE OSC/SAG .038X5.5 CUT EDG (BLADE) IMPLANT
BLADE SURG 10 STRL SS (BLADE) ×2 IMPLANT
BLADE SURG 15 STRL LF DISP TIS (BLADE) ×3 IMPLANT
BLADE SURG 15 STRL SS (BLADE) ×6
BNDG CMPR 9X4 STRL LF SNTH (GAUZE/BANDAGES/DRESSINGS)
BNDG CMPR 9X6 STRL LF SNTH (GAUZE/BANDAGES/DRESSINGS)
BNDG COHESIVE 4X5 TAN STRL (GAUZE/BANDAGES/DRESSINGS) IMPLANT
BNDG COHESIVE 6X5 TAN STRL LF (GAUZE/BANDAGES/DRESSINGS) IMPLANT
BNDG CONFORM 2 STRL LF (GAUZE/BANDAGES/DRESSINGS) IMPLANT
BNDG CONFORM 3 STRL LF (GAUZE/BANDAGES/DRESSINGS) ×2 IMPLANT
BNDG ELASTIC 4X5.8 VLCR STR LF (GAUZE/BANDAGES/DRESSINGS) ×2 IMPLANT
BNDG ESMARK 4X9 LF (GAUZE/BANDAGES/DRESSINGS) IMPLANT
BNDG ESMARK 6X9 LF (GAUZE/BANDAGES/DRESSINGS)
BOOT STEPPER DURA LG (SOFTGOODS) IMPLANT
BOOT STEPPER DURA MED (SOFTGOODS) IMPLANT
BOOT STEPPER DURA SM (SOFTGOODS) ×2 IMPLANT
BOOT STEPPER DURA XLG (SOFTGOODS) IMPLANT
CHLORAPREP W/TINT 26 (MISCELLANEOUS) ×2 IMPLANT
COVER BACK TABLE 60X90IN (DRAPES) ×2 IMPLANT
COVER WAND RF STERILE (DRAPES) IMPLANT
CUFF TOURN SGL QUICK 24 (TOURNIQUET CUFF) ×2
CUFF TOURN SGL QUICK 34 (TOURNIQUET CUFF)
CUFF TRNQT CYL 24X4X16.5-23 (TOURNIQUET CUFF) ×1 IMPLANT
CUFF TRNQT CYL 34X4.125X (TOURNIQUET CUFF) IMPLANT
DRAPE EXTREMITY T 121X128X90 (DISPOSABLE) ×2 IMPLANT
DRAPE OEC MINIVIEW 54X84 (DRAPES) ×2 IMPLANT
DRAPE U-SHAPE 47X51 STRL (DRAPES) ×2 IMPLANT
DRSG MEPITEL 4X7.2 (GAUZE/BANDAGES/DRESSINGS) ×2 IMPLANT
DRSG PAD ABDOMINAL 8X10 ST (GAUZE/BANDAGES/DRESSINGS) ×2 IMPLANT
ELECT REM PT RETURN 9FT ADLT (ELECTROSURGICAL) ×2
ELECTRODE REM PT RTRN 9FT ADLT (ELECTROSURGICAL) ×1 IMPLANT
GAUZE SPONGE 4X4 12PLY STRL (GAUZE/BANDAGES/DRESSINGS) ×2 IMPLANT
GLOVE BIO SURGEON STRL SZ8 (GLOVE) ×2 IMPLANT
GLOVE BIOGEL PI IND STRL 7.0 (GLOVE) ×2 IMPLANT
GLOVE BIOGEL PI IND STRL 8 (GLOVE) ×2 IMPLANT
GLOVE BIOGEL PI INDICATOR 7.0 (GLOVE) ×2
GLOVE BIOGEL PI INDICATOR 8 (GLOVE) ×2
GLOVE ECLIPSE 8.0 STRL XLNG CF (GLOVE) ×2 IMPLANT
GLOVE SURG SS PI 7.0 STRL IVOR (GLOVE) ×2 IMPLANT
GOWN STRL REUS W/ TWL LRG LVL3 (GOWN DISPOSABLE) ×1 IMPLANT
GOWN STRL REUS W/ TWL XL LVL3 (GOWN DISPOSABLE) ×2 IMPLANT
GOWN STRL REUS W/TWL LRG LVL3 (GOWN DISPOSABLE) ×2
GOWN STRL REUS W/TWL XL LVL3 (GOWN DISPOSABLE) ×4
K-WIRE ACE 1.6X6 (WIRE) ×4
KWIRE ACE 1.6X6 (WIRE) ×2 IMPLANT
NEEDLE HYPO 22GX1.5 SAFETY (NEEDLE) IMPLANT
NS IRRIG 1000ML POUR BTL (IV SOLUTION) ×2 IMPLANT
PACK BASIN DAY SURGERY FS (CUSTOM PROCEDURE TRAY) ×2 IMPLANT
PAD CAST 4YDX4 CTTN HI CHSV (CAST SUPPLIES) ×1 IMPLANT
PADDING CAST ABS 4INX4YD NS (CAST SUPPLIES)
PADDING CAST ABS COTTON 4X4 ST (CAST SUPPLIES) IMPLANT
PADDING CAST COTTON 4X4 STRL (CAST SUPPLIES) ×2
PADDING CAST COTTON 6X4 STRL (CAST SUPPLIES) IMPLANT
PENCIL SMOKE EVACUATOR (MISCELLANEOUS) ×2 IMPLANT
PLATE TUB 39 W/COLLAR 5H (Plate) ×2 IMPLANT
SANITIZER HAND PURELL 535ML FO (MISCELLANEOUS) ×2 IMPLANT
SCREW CANNULATED 4.0X38 1/2 (Screw) ×2 IMPLANT
SCREW CORT 2.5X20X2.7XST SM (Screw) ×2 IMPLANT
SCREW CORTICAL 2.7X14MM (Screw) ×2 IMPLANT
SCREW CORTICAL 2.7X18MM (Screw) ×2 IMPLANT
SCREW CORTICAL 2.7X20MM (Screw) ×4 IMPLANT
SCREW HCS TWIST-OFF 2.0X12MM (Screw) ×2 IMPLANT
SHEET MEDIUM DRAPE 40X70 STRL (DRAPES) ×2 IMPLANT
SLEEVE SCD COMPRESS KNEE MED (MISCELLANEOUS) ×2 IMPLANT
SPLINT FAST PLASTER 5X30 (CAST SUPPLIES)
SPLINT PLASTER CAST FAST 5X30 (CAST SUPPLIES) IMPLANT
SPONGE LAP 18X18 RF (DISPOSABLE) ×2 IMPLANT
SPONGE SURGIFOAM ABS GEL 12-7 (HEMOSTASIS) IMPLANT
STOCKINETTE 6  STRL (DRAPES) ×2
STOCKINETTE 6 STRL (DRAPES) ×1 IMPLANT
SUCTION FRAZIER HANDLE 10FR (MISCELLANEOUS) ×2
SUCTION TUBE FRAZIER 10FR DISP (MISCELLANEOUS) ×1 IMPLANT
SUT ETHILON 3 0 PS 1 (SUTURE) ×2 IMPLANT
SUT MNCRL AB 3-0 PS2 18 (SUTURE) ×2 IMPLANT
SUT VIC AB 0 SH 27 (SUTURE) IMPLANT
SUT VIC AB 2-0 SH 27 (SUTURE) ×2
SUT VIC AB 2-0 SH 27XBRD (SUTURE) ×1 IMPLANT
SUT VICRYL 0 UR6 27IN ABS (SUTURE) IMPLANT
SYR BULB EAR ULCER 3OZ GRN STR (SYRINGE) ×2 IMPLANT
SYR CONTROL 10ML LL (SYRINGE) IMPLANT
TOWEL GREEN STERILE FF (TOWEL DISPOSABLE) ×4 IMPLANT
TUBE CONNECTING 20X1/4 (TUBING) ×2 IMPLANT
UNDERPAD 30X36 HEAVY ABSORB (UNDERPADS AND DIAPERS) ×2 IMPLANT
YANKAUER SUCT BULB TIP NO VENT (SUCTIONS) IMPLANT

## 2020-03-14 NOTE — Op Note (Signed)
03/14/2020  9:50 AM  PATIENT:  Rachael Waller  54 y.o. female  PRE-OPERATIVE DIAGNOSIS:   1.  Right foot hallux rigidus      2.  Right forefoot metatarsalgia  POST-OPERATIVE DIAGNOSIS: Same  Procedure(s): 1.  Right hallux metatarsophalangeal arthrodesis 2.  Right Second metatarsal Weil osteotomy 3  AP, lateral and oblique xrays of the right foot  SURGEON:  Toni Arthurs, MD  ASSISTANT: Alfredo Martinez, PA-C  ANESTHESIA:   General, regional  EBL:  minimal   TOURNIQUET:   Total Tourniquet Time Documented: Thigh (Right) - 40 minutes Total: Thigh (Right) - 40 minutes  COMPLICATIONS:  None apparent  DISPOSITION:  Extubated, awake and stable to recovery.  INDICATION FOR PROCEDURE: The patient is a 54 year old female with a past medical history significant for smoking.  She has a long history of right forefoot pain due to hallux rigidus and metatarsalgia.  She has failed nonoperative treatment including activity modification, oral anti-inflammatories and shoewear modification.  She presents now for surgical treatment of these painful and limiting forefoot conditions.  The risks and benefits of the alternative treatment options have been discussed in detail.  The patient wishes to proceed with surgery and specifically understands risks of bleeding, infection, nerve damage, blood clots, need for additional surgery, amputation and death.  PROCEDURE IN DETAIL:  After pre operative consent was obtained, and the correct operative site was identified, the patient was brought to the operating room and placed supine on the OR table.  Anesthesia was administered.  Pre-operative antibiotics were administered.  A surgical timeout was taken.  The right lower extremity was prepped and draped in standard sterile fashion with a tourniquet around the thigh.  The extremity was elevated and the tourniquet was inflated to 250 mmHg.  A longitudinal incision was made over the hallux MP joint.  Dissection was  carried down through the subcutaneous tissues.  The extensor tendons were elevated and protected throughout the case.  Collateral ligaments were released exposing the metatarsal head.  A concave reamer was used to remove the remaining articular cartilage and subchondral bone.  A convex reamer was used to remove the remaining articular cartilage and subchondral bone from the base of the proximal phalanx.  The wound was irrigated copiously.  A small drill bit was used to perforate both sides of the joint leaving the resultant bone graft in place.  The joint was reduced and provisionally pinned.  AP and lateral radiographs confirmed appropriate alignment of the joint.  Simulated weightbearing examination showed appropriate dorsiflexion of the toe.  The joint was then compressed with a 4 mm partially-threaded stainless steel cannulated screw from the [employer] stainless steel cannulated screw set.  A 5 hole one quarter tubular plate from the stainless steel [employer] mini frag set was then contoured to fit the dorsum of the joint.  It was secured proximally with 2 bicortical screws and distally with 2 bicortical screws.  AP and lateral radiographs confirmed appropriate reduction of the hallux MP joint and appropriate position and length of all hardware.  The wound was irrigated and sprinkled with vancomycin powder.  The flexor houses brevis tendon was transferred to the extensor tendon with 2-0 Vicryl.  Subcutaneous tissues were approximated with 2-0 Vicryl.  Skin incision was closed with 3-0 nylon.  Attention was turned to the second MTP joint where a longitudinal incision was made.  Dissection was carried down to the subcutaneous tissues.  The extensor tendons were retracted laterally and the dorsal joint  capsule was incised and elevated exposing the metatarsal head.  A Weil osteotomy was made with the oscillating saw all removing a small wedge of bone distally.  The head was allowed to translate a few  millimeters proximally and was fixed with a 12 mm [employer] FRS screw.  Overhanging bone was trimmed with a rondure.  Radiographs confirmed appropriate shortening of the second metatarsal and appropriate position of the screw.  The wound was irrigated and sprinkled with vancomycin powder.  Subcutaneous tissues were approximated with Monocryl and skin incision was closed with nylon.  Sterile dressings were applied followed by compression wrap and a tall cam boot.  Tourniquet was released after application of the dressings.  The patient was awakened from anesthesia and transported to the recovery room in stable condition.   FOLLOW UP PLAN: Weightbearing as tolerated on the heel in a tall cam boot.  Follow-up in the office in 2 weeks for suture removal.  Plan 6 weeks immobilization in the cam boot.   RADIOGRAPHS: AP, lateral and oblique radiographs of the right foot are obtained intraoperatively.  These show interval arthrodesis of the hallux MP joint and shortening of the second metatarsal.  Hardware is appropriately positioned and of the appropriate lengths.  No other acute injuries are noted.    Alfredo Martinez PA-C was present and scrubbed for the duration of the operative case. His assistance was essential in positioning the patient, prepping and draping, gaining and maintaining exposure, performing the operation, closing and dressing the wounds and applying the splint.

## 2020-03-14 NOTE — Anesthesia Postprocedure Evaluation (Signed)
Anesthesia Post Note  Patient: Rachael Waller  Procedure(s) Performed: Right hallux metatarsophalangeal arthrodesis (Right Foot) Right Second metatarsal Weil osteotomy (Right Foot)     Patient location during evaluation: PACU Anesthesia Type: General Level of consciousness: sedated and patient cooperative Pain management: pain level controlled Vital Signs Assessment: post-procedure vital signs reviewed and stable Respiratory status: spontaneous breathing Cardiovascular status: stable Anesthetic complications: no   No complications documented.  Last Vitals:  Vitals:   03/14/20 1027 03/14/20 1044  BP: 117/62 119/71  Pulse: 82 85  Resp: 19 16  Temp: (!) 36.4 C 36.5 C  SpO2: 97% 98%    Last Pain:  Vitals:   03/14/20 1044  TempSrc:   PainSc: 0-No pain                 Lewie Loron

## 2020-03-14 NOTE — Discharge Instructions (Addendum)
Rachael Hewitt, MD [facility]  Please read the following information regarding your care after surgery.  Medications  You only need a prescription for the narcotic pain medicine (ex. oxycodone, Percocet, Norco).  All of the other medicines listed below are available over the counter. X Aleve 2 pills twice a day for the first 3 days after surgery. X acetominophen (Tylenol) 650 mg every 4-6 hours as you need for minor to moderate pain X oxycodone as prescribed for severe pain  Narcotic pain medicine (ex. oxycodone, Percocet, Vicodin) will cause constipation.  To prevent this problem, take the following medicines while you are taking any pain medicine. X docusate sodium (Colace) 100 mg twice a day X senna (Senokot) 2 tablets twice a day   Weight Bearing X Bear weight only on your operated foot in the CAM boot.   Cast / Splint / Dressing X Keep your splint, cast or dressing clean and dry.  Don't put anything (coat hanger, pencil, etc) down inside of it.  If it gets damp, use a hair dryer on the cool setting to dry it.  If it gets soaked, call the office to schedule an appointment for a cast change.   After your dressing, cast or splint is removed; you may shower, but do not soak or scrub the wound.  Allow the water to run over it, and then gently pat it dry.  Swelling It is normal for you to have swelling where you had surgery.  To reduce swelling and pain, keep your toes above your nose for at least 3 days after surgery.  It may be necessary to keep your foot or leg elevated for several weeks.  If it hurts, it should be elevated.  Follow Up Call my office at [phone] when you are discharged from the hospital or surgery center to schedule an appointment to be seen two weeks after surgery.  Call my office at [phone] if you develop a fever >101.5 F, nausea, vomiting, bleeding from the surgical site or severe pain.    Post Anesthesia Home Care Instructions  Activity: Get plenty  of rest for the remainder of the day. A responsible individual must stay with you for 24 hours following the procedure.  For the next 24 hours, DO NOT: -Drive a car -Operate machinery -Drink alcoholic beverages -Take any medication unless instructed by your physician -Make any legal decisions or sign important papers.  Meals: Start with liquid foods such as gelatin or soup. Progress to regular foods as tolerated. Avoid greasy, spicy, heavy foods. If nausea and/or vomiting occur, drink only clear liquids until the nausea and/or vomiting subsides. Call your physician if vomiting continues.  Special Instructions/Symptoms: Your throat may feel dry or sore from the anesthesia or the breathing tube placed in your throat during surgery. If this causes discomfort, gargle with warm salt water. The discomfort should disappear within 24 hours.  If you had a scopolamine patch placed behind your ear for the management of post- operative nausea and/or vomiting:  1. The medication in the patch is effective for 72 hours, after which it should be removed.  Wrap patch in a tissue and discard in the trash. Wash hands thoroughly with soap and water. 2. You may remove the patch earlier than 72 hours if you experience unpleasant side effects which may include dry mouth, dizziness or visual disturbances. 3. Avoid touching the patch. Wash your hands with soap and water after contact with the patch.    Regional Anesthesia Blocks  1.   Numbness or the inability to move the "blocked" extremity may last from 3-48 hours after placement. The length of time depends on the medication injected and your individual response to the medication. If the numbness is not going away after 48 hours, call your surgeon.  2. The extremity that is blocked will need to be protected until the numbness is gone and the  Strength has returned. Because you cannot feel it, you will need to take extra care to avoid injury. Because it may be weak,  you may have difficulty moving it or using it. You may not know what position it is in without looking at it while the block is in effect.  3. For blocks in the legs and feet, returning to weight bearing and walking needs to be done carefully. You will need to wait until the numbness is entirely gone and the strength has returned. You should be able to move your leg and foot normally before you try and bear weight or walk. You will need someone to be with you when you first try to ensure you do not fall and possibly risk injury.  4. Bruising and tenderness at the needle site are common side effects and will resolve in a few days.  5. Persistent numbness or new problems with movement should be communicated to the surgeon or the [facility] ([phone])/ [facility] ([phone]).   

## 2020-03-14 NOTE — Anesthesia Procedure Notes (Signed)
Procedure Name: LMA Insertion Date/Time: 03/14/2020 8:48 AM Performed by: Sheryn Bison, CRNA Pre-anesthesia Checklist: Patient identified, Emergency Drugs available, Suction available and Patient being monitored Patient Re-evaluated:Patient Re-evaluated prior to induction Oxygen Delivery Method: Circle system utilized Preoxygenation: Pre-oxygenation with 100% oxygen Induction Type: IV induction Ventilation: Mask ventilation without difficulty LMA: LMA inserted LMA Size: 4.0 Number of attempts: 1 Airway Equipment and Method: Bite block Placement Confirmation: positive ETCO2 Tube secured with: Tape Dental Injury: Teeth and Oropharynx as per pre-operative assessment

## 2020-03-14 NOTE — Anesthesia Procedure Notes (Signed)
Anesthesia Regional Block: Popliteal block   Pre-Anesthetic Checklist: ,, timeout performed, Correct Patient, Correct Site, Correct Laterality, Correct Procedure, Correct Position, site marked, Risks and benefits discussed,  Surgical consent,  Pre-op evaluation,  At surgeon's request and post-op pain management  Laterality: Right  Prep: chloraprep       Needles:  Injection technique: Single-shot  Needle Type: Stimiplex     Needle Length: 10cm  Needle Gauge: 21     Additional Needles:   Procedures:,,,, ultrasound used (permanent image in chart),,,,  Motor weakness within 5 minutes.   Nerve Stimulator or Paresthesia:  Response: Plantar flexion/toe flexion, 0.5 mA,   Additional Responses:   Narrative:  Start time: 03/14/2020 7:48 AM End time: 03/14/2020 7:53 AM Injection made incrementally with aspirations every 5 mL.  Performed by: Personally  Anesthesiologist: Lewie Loron, MD  Additional Notes: Nerve located and needle positioned with direct ultrasound guidance. Good perineural spread. Patient tolerated well.

## 2020-03-14 NOTE — H&P (Signed)
Rachael Waller is an 54 y.o. female.   Chief Complaint: Right foot pain HPI: The patient is a 54 year old female without significant past medical history.  She has a long history of right forefoot pain due to hallux rigidus and metatarsalgia.  She has failed nonoperative treatment to date including activity modification, oral anti-inflammatories and shoewear modification.  She presents now for surgical treatment of these painful right forefoot conditions.  Past Medical History:  Diagnosis Date  . Asthma   . PONV (postoperative nausea and vomiting)     Past Surgical History:  Procedure Laterality Date  . CHOLECYSTECTOMY    . TUBAL LIGATION      History reviewed. No pertinent family history. Social History:  reports that she has been smoking cigarettes. She has been smoking about 0.50 packs per day. She has never used smokeless tobacco. She reports that she does not drink alcohol and does not use drugs.  Allergies: No Known Allergies  Medications Prior to Admission  Medication Sig Dispense Refill  . diazepam (VALIUM) 10 MG tablet Take 10 mg by mouth every 12 (twelve) hours as needed for anxiety.    . diclofenac (VOLTAREN) 50 MG EC tablet Take 1 tablet (50 mg total) by mouth 2 (two) times daily. 20 tablet 0  . albuterol (PROVENTIL HFA;VENTOLIN HFA) 108 (90 Base) MCG/ACT inhaler Inhale 1-2 puffs into the lungs every 6 (six) hours as needed for wheezing or shortness of breath.    Marland Kitchen albuterol (PROVENTIL) (2.5 MG/3ML) 0.083% nebulizer solution Take 2.5 mg by nebulization every 6 (six) hours as needed for wheezing or shortness of breath.    . ketotifen (ALLERGY EYE DROPS) 0.025 % ophthalmic solution Place 1 drop into both eyes daily as needed (for allergy eye).    . methocarbamol (ROBAXIN) 500 MG tablet Take 1 tablet (500 mg total) by mouth 4 (four) times daily. 20 tablet 0    No results found for this or any previous visit (from the past 48 hour(s)). No results found.  Review of Systems  no recent fever, chills, nausea, vomiting or changes in her appetite  Blood pressure (!) 106/57, pulse 75, temperature 97.9 F (36.6 C), temperature source Oral, resp. rate 18, height 5\' 2"  (1.575 m), weight 58.6 kg, SpO2 100 %. Physical Exam  Well-nourished well-developed woman in no apparent distress.  Alert and oriented x4.  Normal mood and affect.  Gait is antalgic to the right.  The right foot has healthy skin.  Decreased range of motion of the hallux MP joint.  Tender to palpation of the second metatarsal phalangeal joint.  5 out of 5 strength in plantarflexion and dorsiflexion of the ankle and toes.  No lymphadenopathy.  Pulses are palpable.  Normal sensibility to light touch dorsally and plantarly at the forefoot.   Assessment/Plan Right hallux rigidus and metatarsalgia -to the operating room today for hallux MP joint arthrodesis and second metatarsal Weil osteotomy  The risks and benefits of the alternative treatment options have been discussed in detail.  The patient wishes to proceed with surgery and specifically understands risks of bleeding, infection, nerve damage, blood clots, need for additional surgery, amputation and death.   , MD 2020/03/15, 8:39 AM

## 2020-03-14 NOTE — Progress Notes (Signed)
Assisted Dr. Germeroth with right, ultrasound guided, popliteal block. Side rails up, monitors on throughout procedure. See vital signs in flow sheet. Tolerated Procedure well. 

## 2020-03-14 NOTE — Transfer of Care (Signed)
Immediate Anesthesia Transfer of Care Note  Patient: Rachael Waller  Procedure(s) Performed: Right hallux metatarsophalangeal arthrodesis (Right Foot) Right Second metatarsal Weil osteotomy (Right Foot)  Patient Location: PACU  Anesthesia Type:GA combined with regional for post-op pain  Level of Consciousness: drowsy and patient cooperative  Airway & Oxygen Therapy: Patient Spontanous Breathing and Patient connected to face mask oxygen  Post-op Assessment: Report given to RN and Post -op Vital signs reviewed and stable  Post vital signs: Reviewed and stable  Last Vitals:  Vitals Value Taken Time  BP    Temp    Pulse 65 03/14/20 0949  Resp 12 03/14/20 0949  SpO2 100 % 03/14/20 0949  Vitals shown include unvalidated device data.  Last Pain:  Vitals:   03/14/20 0703  TempSrc: Oral  PainSc: 0-No pain         Complications: No complications documented.

## 2020-03-18 ENCOUNTER — Encounter (HOSPITAL_BASED_OUTPATIENT_CLINIC_OR_DEPARTMENT_OTHER): Payer: Self-pay | Admitting: Orthopedic Surgery

## 2020-08-01 ENCOUNTER — Other Ambulatory Visit (HOSPITAL_COMMUNITY): Payer: Self-pay | Admitting: Family Medicine

## 2020-08-01 DIAGNOSIS — Z1231 Encounter for screening mammogram for malignant neoplasm of breast: Secondary | ICD-10-CM

## 2021-01-12 IMAGING — DX DG LUMBAR SPINE COMPLETE 4+V
5 series · 5 of 5 positions shown · non-contrast
Comparison: 07/10/2010 lumbar spine radiographs. 10/14/2015
unenhanced CT abdomen/pelvis.

CLINICAL DATA: Fall down steps/tear. Low back pain since yesterday.
No recent injury reported.

EXAM:
LUMBAR SPINE - COMPLETE 4+ VIEW

[l-spine ap]
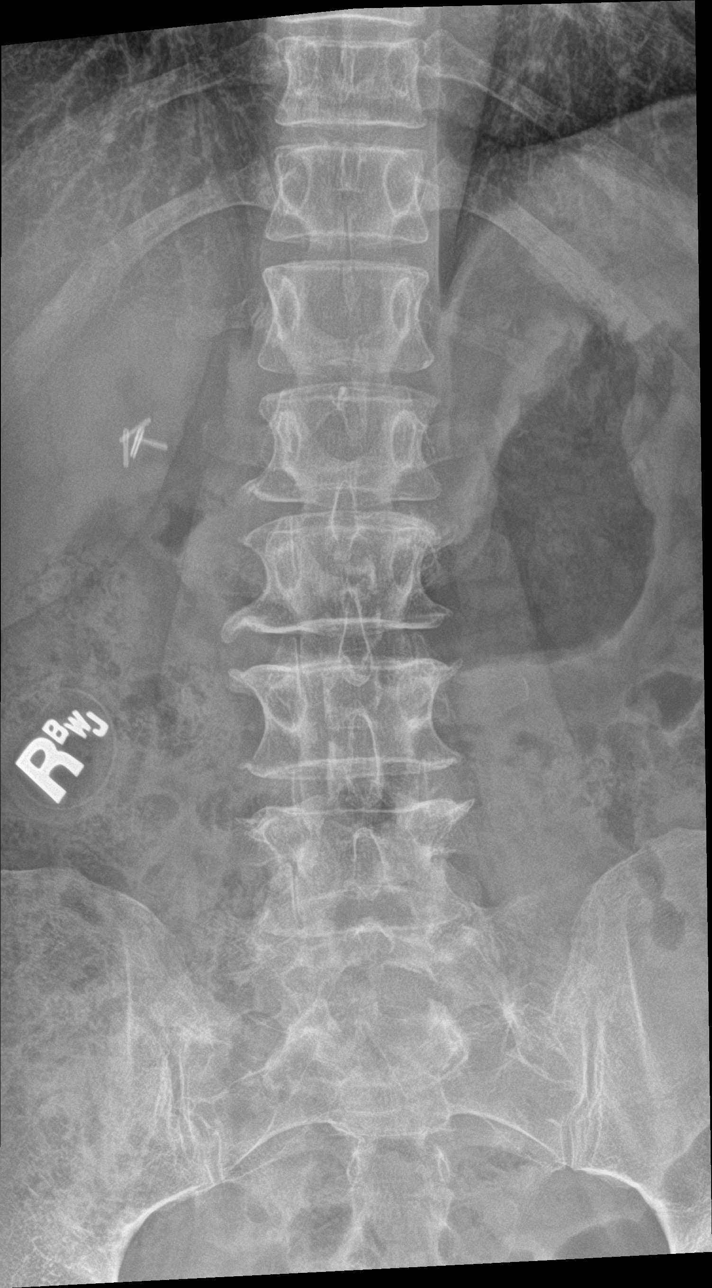

[l-spine obl (1 of 2)]
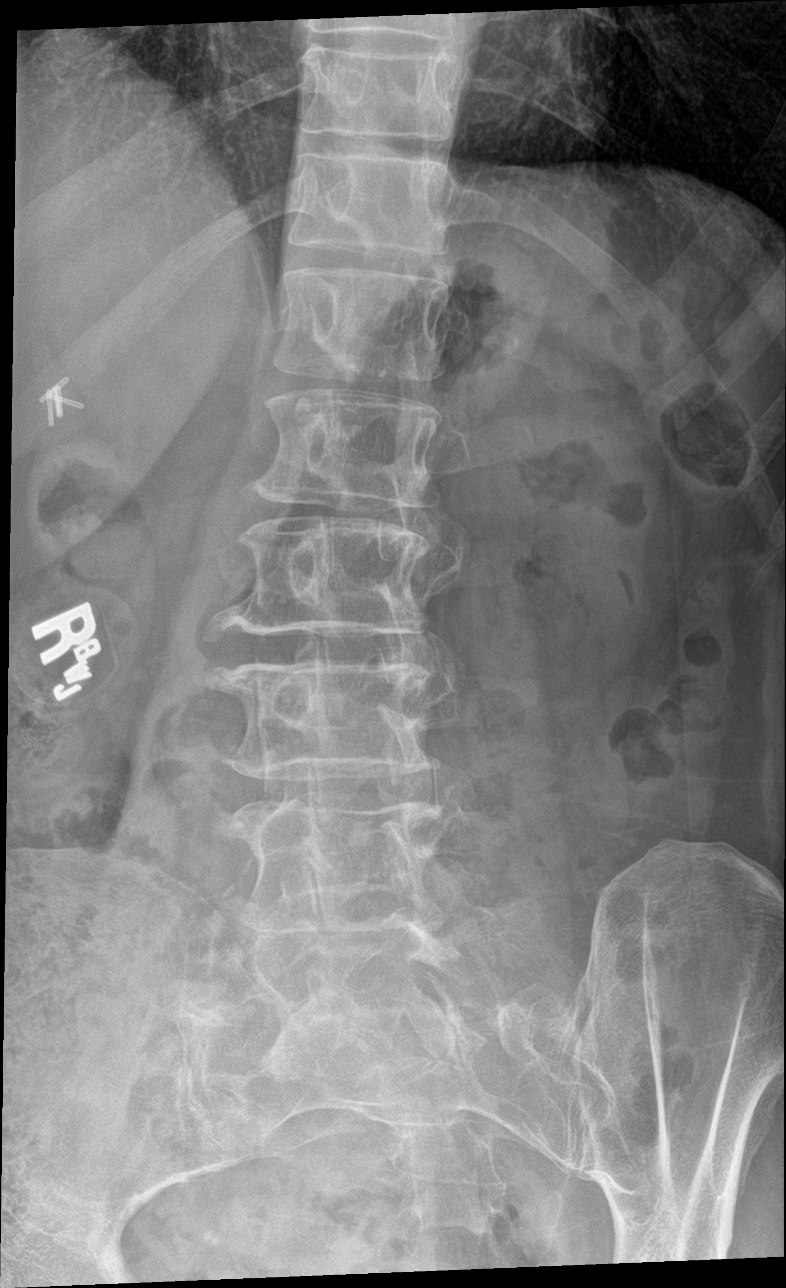

[l-spine obl (2 of 2)]
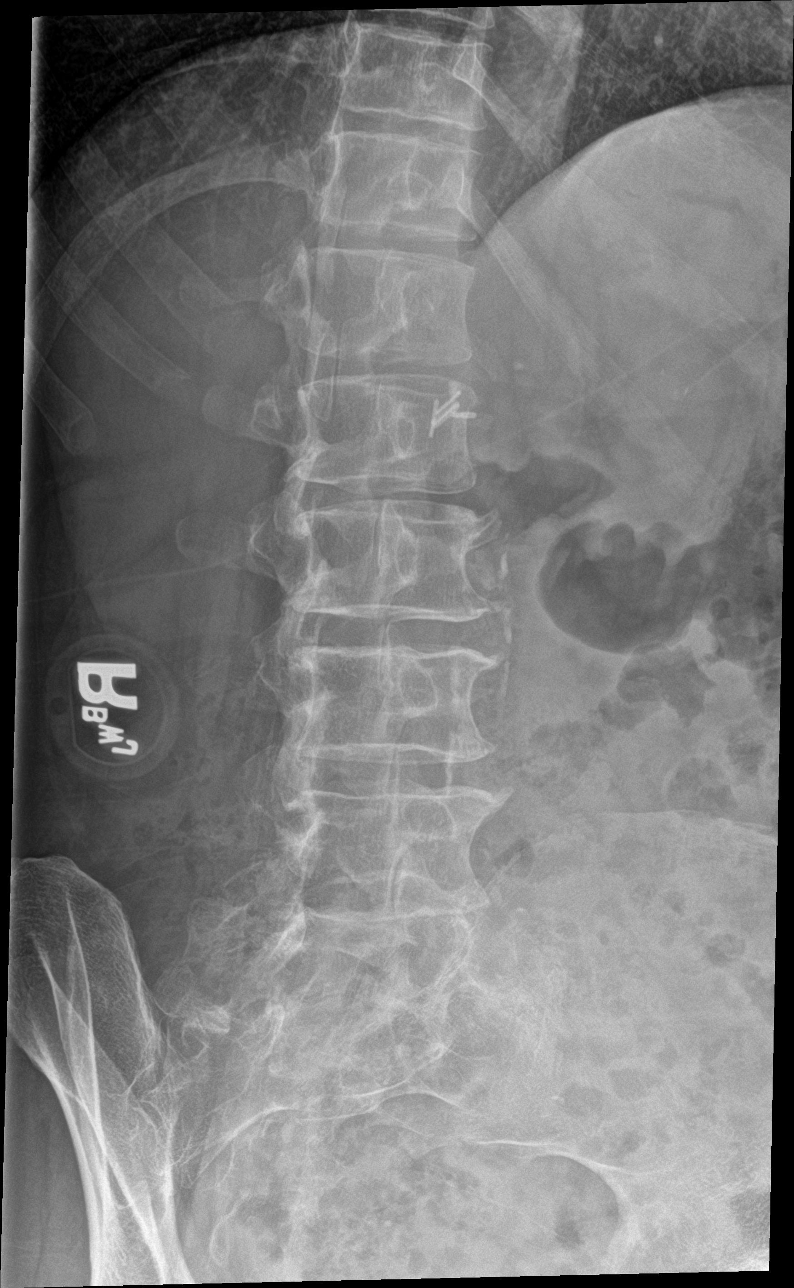

[l-spine lat]
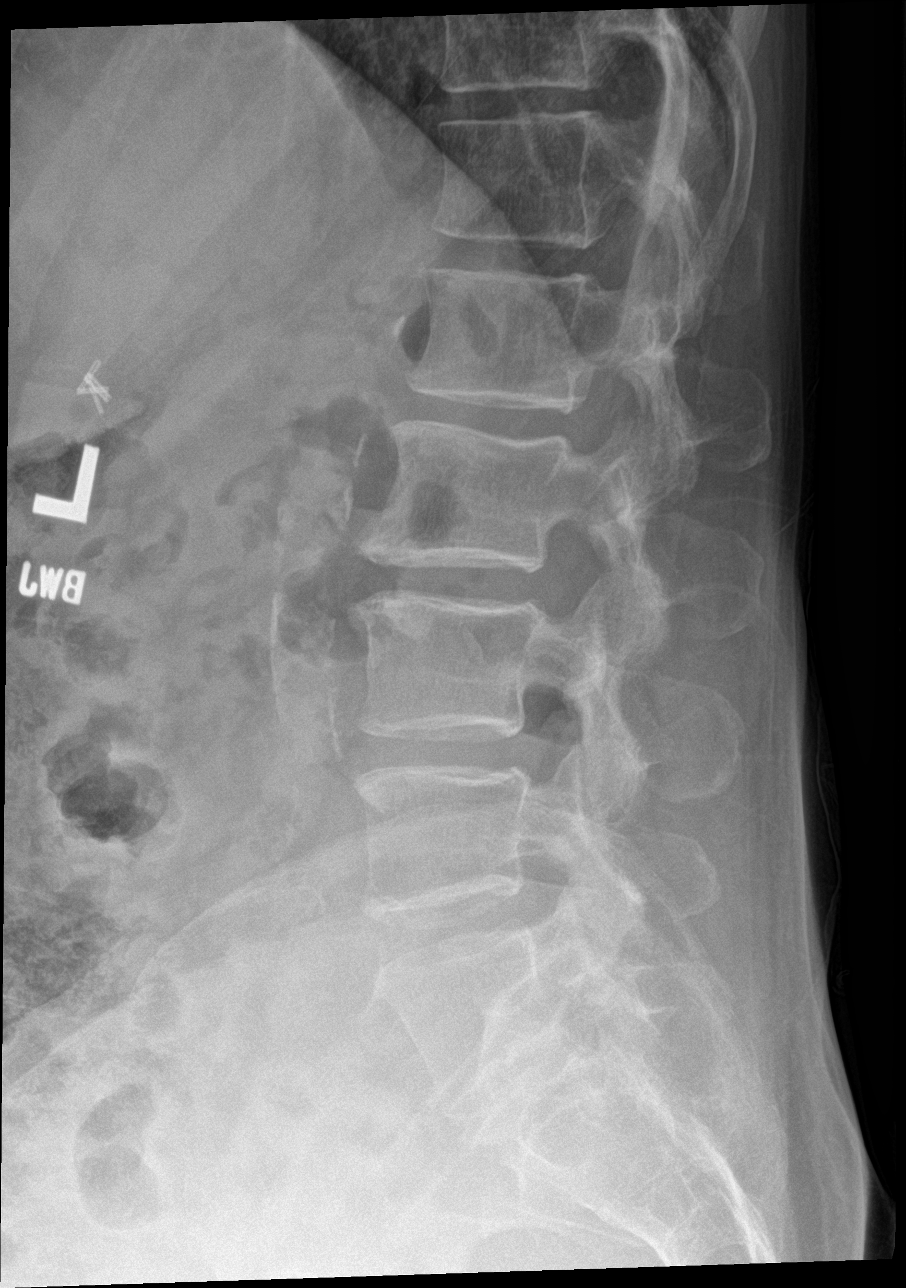

[l-spine spot]
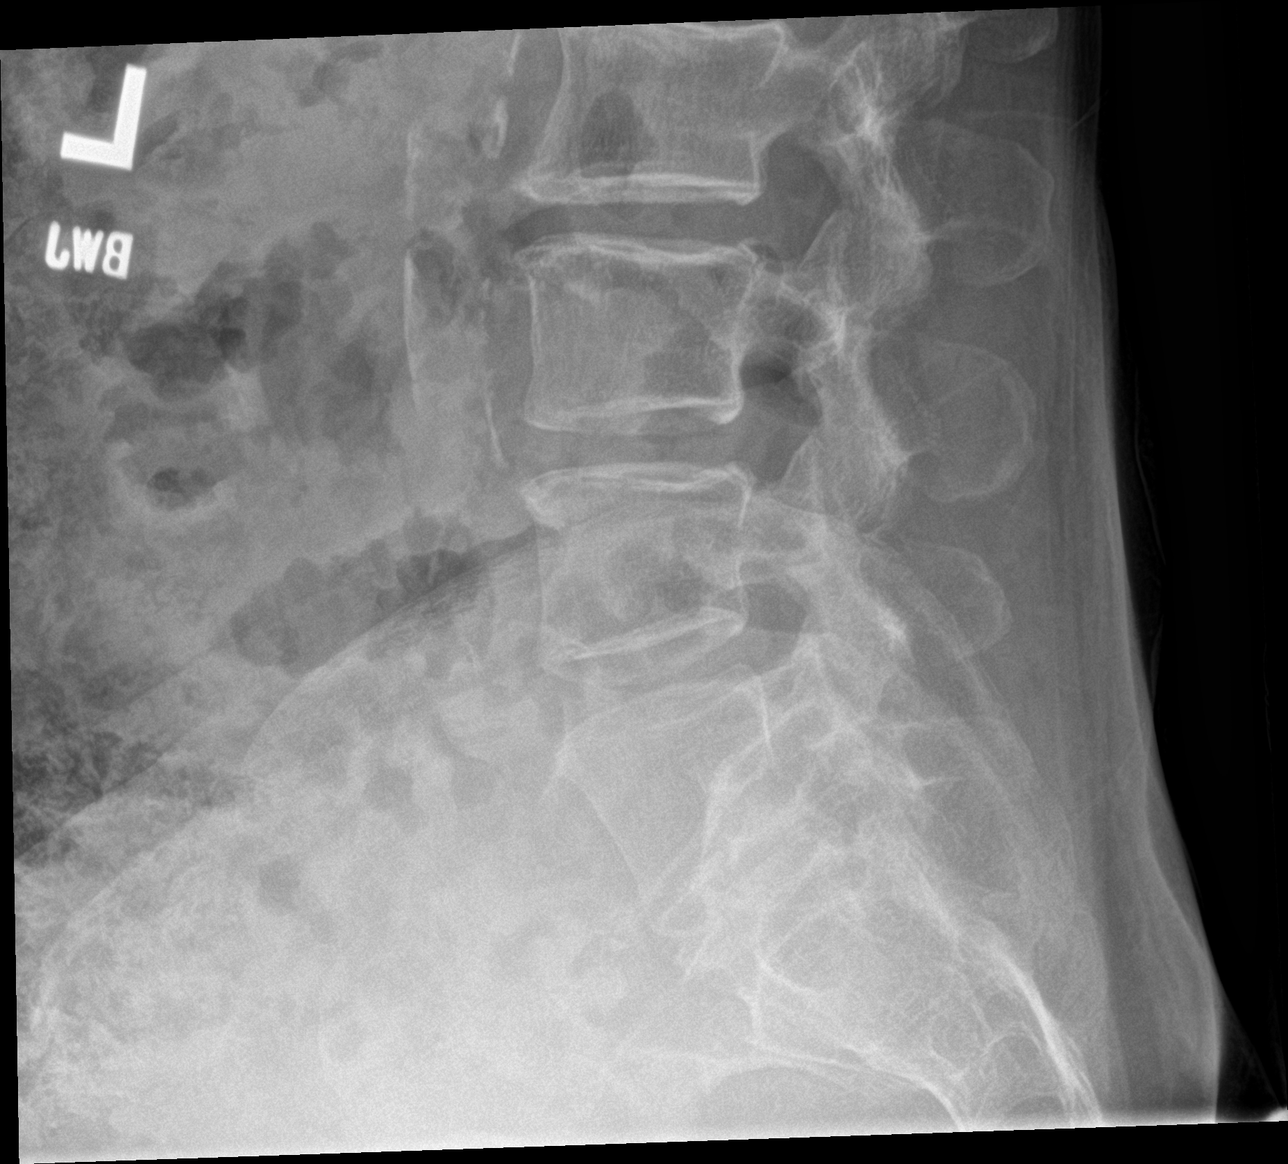

[5 of 5 positions shown; findings below may reference images not displayed]

FINDINGS: This report assumes 5 non rib-bearing lumbar vertebrae.

Lumbar vertebral body heights are preserved, with no fracture.

Mild multilevel lumbar degenerative disc disease, most prominent at
L2-3, mildly worsened. No spondylolisthesis. No appreciable facet
arthropathy. No aggressive appearing focal osseous lesions.
Cholecystectomy clips are seen in the right upper quadrant of the
abdomen. Abdominal aortic atherosclerosis.
IMPRESSION: 1. No lumbar spine fracture or spondylolisthesis.
2. Mild multilevel lumbar degenerative disc disease, most prominent
at L2-3, mildly worsened since 5845 radiographs.

## 2023-10-27 ENCOUNTER — Other Ambulatory Visit (HOSPITAL_COMMUNITY): Payer: Self-pay | Admitting: Family Medicine

## 2023-10-27 DIAGNOSIS — Z1231 Encounter for screening mammogram for malignant neoplasm of breast: Secondary | ICD-10-CM

## 2023-10-28 ENCOUNTER — Encounter: Payer: Self-pay | Admitting: *Deleted

## 2024-05-01 ENCOUNTER — Encounter (INDEPENDENT_AMBULATORY_CARE_PROVIDER_SITE_OTHER): Payer: Self-pay | Admitting: *Deleted

## 2024-05-01 ENCOUNTER — Other Ambulatory Visit (HOSPITAL_COMMUNITY): Payer: Self-pay | Admitting: Family Medicine

## 2024-05-01 DIAGNOSIS — Z1231 Encounter for screening mammogram for malignant neoplasm of breast: Secondary | ICD-10-CM

## 2024-05-22 ENCOUNTER — Ambulatory Visit (HOSPITAL_COMMUNITY)

## 2024-06-19 ENCOUNTER — Encounter (HOSPITAL_COMMUNITY): Payer: Self-pay

## 2024-06-19 ENCOUNTER — Ambulatory Visit (HOSPITAL_COMMUNITY)
Admission: RE | Admit: 2024-06-19 | Discharge: 2024-06-19 | Disposition: A | Discharge status: Home / Self Care | Source: Ambulatory Visit | Attending: Family Medicine | Admitting: Family Medicine

## 2024-06-19 DIAGNOSIS — Z1231 Encounter for screening mammogram for malignant neoplasm of breast: Secondary | ICD-10-CM | POA: Diagnosis present

## 2024-07-10 ENCOUNTER — Other Ambulatory Visit (HOSPITAL_COMMUNITY): Payer: Self-pay | Admitting: Family Medicine

## 2024-07-10 DIAGNOSIS — R928 Other abnormal and inconclusive findings on diagnostic imaging of breast: Secondary | ICD-10-CM

## 2024-08-01 ENCOUNTER — Ambulatory Visit (HOSPITAL_COMMUNITY)

## 2024-08-01 ENCOUNTER — Encounter (HOSPITAL_COMMUNITY)

## 2024-08-15 ENCOUNTER — Encounter (HOSPITAL_COMMUNITY)

## 2024-08-15 ENCOUNTER — Ambulatory Visit (HOSPITAL_COMMUNITY)
